# Patient Record
Sex: Female | Born: 2000 | Race: Black or African American | Hispanic: No | Marital: Single | Smoking: Never smoker
Health system: Southern US, Community
[De-identification: ages and names within clinical notes are randomized; demographics above are authoritative.]

---
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
## 2021-01-31 ENCOUNTER — Telehealth: Payer: Self-pay | Admitting: Adult Health

## 2021-01-31 NOTE — Telephone Encounter (Signed)
Spoke to patient about COVID 19 diagnosis.  Today is day 6=5 of infection with + at home test.  She has not had recent BMET and is not on any current medications.  Her BMI Is 26, and she meets criteria of an at risk group to receive treatment for COVID 19.  I sent a message to Dr. Nicholos Johns to evaluate the possibility of her getting in for lab testing today.  I reviewed with her paxlovid or molnupiravir.  I recommended that she also reach out to her PCP to request a lab appointment as today is her last day to start oral therapy.    Lillard Anes NP

## 2021-02-24 DIAGNOSIS — Z0000 Encounter for general adult medical examination without abnormal findings: Secondary | ICD-10-CM | POA: Diagnosis not present

## 2021-02-24 DIAGNOSIS — J3089 Other allergic rhinitis: Secondary | ICD-10-CM | POA: Diagnosis not present

## 2021-02-24 DIAGNOSIS — L309 Dermatitis, unspecified: Secondary | ICD-10-CM | POA: Diagnosis not present

## 2021-02-24 DIAGNOSIS — Z3041 Encounter for surveillance of contraceptive pills: Secondary | ICD-10-CM | POA: Diagnosis not present

## 2021-02-24 DIAGNOSIS — J45991 Cough variant asthma: Secondary | ICD-10-CM | POA: Diagnosis not present

## 2021-04-07 DIAGNOSIS — Z20822 Contact with and (suspected) exposure to covid-19: Secondary | ICD-10-CM | POA: Diagnosis not present

## 2021-04-21 DIAGNOSIS — D509 Iron deficiency anemia, unspecified: Secondary | ICD-10-CM | POA: Diagnosis not present

## 2022-03-03 DIAGNOSIS — Z13220 Encounter for screening for lipoid disorders: Secondary | ICD-10-CM | POA: Diagnosis not present

## 2022-03-03 DIAGNOSIS — Z0000 Encounter for general adult medical examination without abnormal findings: Secondary | ICD-10-CM | POA: Diagnosis not present

## 2022-03-03 DIAGNOSIS — Z23 Encounter for immunization: Secondary | ICD-10-CM | POA: Diagnosis not present

## 2022-09-05 DIAGNOSIS — Z01411 Encounter for gynecological examination (general) (routine) with abnormal findings: Secondary | ICD-10-CM | POA: Diagnosis not present

## 2022-09-05 DIAGNOSIS — Z124 Encounter for screening for malignant neoplasm of cervix: Secondary | ICD-10-CM | POA: Diagnosis not present

## 2022-09-05 DIAGNOSIS — Z113 Encounter for screening for infections with a predominantly sexual mode of transmission: Secondary | ICD-10-CM | POA: Diagnosis not present

## 2022-09-05 DIAGNOSIS — N912 Amenorrhea, unspecified: Secondary | ICD-10-CM | POA: Diagnosis not present

## 2023-03-15 DIAGNOSIS — Z0000 Encounter for general adult medical examination without abnormal findings: Secondary | ICD-10-CM | POA: Diagnosis not present

## 2023-03-15 DIAGNOSIS — Z13220 Encounter for screening for lipoid disorders: Secondary | ICD-10-CM | POA: Diagnosis not present

## 2023-11-26 DIAGNOSIS — Z01419 Encounter for gynecological examination (general) (routine) without abnormal findings: Secondary | ICD-10-CM | POA: Diagnosis not present

## 2023-11-26 DIAGNOSIS — Z113 Encounter for screening for infections with a predominantly sexual mode of transmission: Secondary | ICD-10-CM | POA: Diagnosis not present

## 2023-12-31 DIAGNOSIS — F419 Anxiety disorder, unspecified: Secondary | ICD-10-CM | POA: Diagnosis not present

## 2024-01-07 DIAGNOSIS — F4389 Other reactions to severe stress: Secondary | ICD-10-CM | POA: Diagnosis not present

## 2024-01-17 DIAGNOSIS — F4389 Other reactions to severe stress: Secondary | ICD-10-CM | POA: Diagnosis not present

## 2024-01-28 DIAGNOSIS — F4389 Other reactions to severe stress: Secondary | ICD-10-CM | POA: Diagnosis not present

## 2024-02-01 DIAGNOSIS — F4389 Other reactions to severe stress: Secondary | ICD-10-CM | POA: Diagnosis not present

## 2024-02-06 DIAGNOSIS — F4389 Other reactions to severe stress: Secondary | ICD-10-CM | POA: Diagnosis not present

## 2024-02-14 DIAGNOSIS — F4389 Other reactions to severe stress: Secondary | ICD-10-CM | POA: Diagnosis not present

## 2024-02-18 DIAGNOSIS — F4389 Other reactions to severe stress: Secondary | ICD-10-CM | POA: Diagnosis not present

## 2024-03-10 DIAGNOSIS — F4389 Other reactions to severe stress: Secondary | ICD-10-CM | POA: Diagnosis not present

## 2024-03-17 DIAGNOSIS — F4389 Other reactions to severe stress: Secondary | ICD-10-CM | POA: Diagnosis not present

## 2024-03-31 DIAGNOSIS — F4389 Other reactions to severe stress: Secondary | ICD-10-CM | POA: Diagnosis not present

## 2024-04-07 DIAGNOSIS — F4389 Other reactions to severe stress: Secondary | ICD-10-CM | POA: Diagnosis not present

## 2024-04-14 DIAGNOSIS — F4389 Other reactions to severe stress: Secondary | ICD-10-CM | POA: Diagnosis not present

## 2024-04-15 DIAGNOSIS — Z0000 Encounter for general adult medical examination without abnormal findings: Secondary | ICD-10-CM | POA: Diagnosis not present

## 2024-04-21 DIAGNOSIS — F4389 Other reactions to severe stress: Secondary | ICD-10-CM | POA: Diagnosis not present

## 2024-04-28 DIAGNOSIS — F4389 Other reactions to severe stress: Secondary | ICD-10-CM | POA: Diagnosis not present

## 2024-05-05 DIAGNOSIS — F4389 Other reactions to severe stress: Secondary | ICD-10-CM | POA: Diagnosis not present

## 2024-05-15 DIAGNOSIS — F4389 Other reactions to severe stress: Secondary | ICD-10-CM | POA: Diagnosis not present

## 2024-05-19 DIAGNOSIS — F4389 Other reactions to severe stress: Secondary | ICD-10-CM | POA: Diagnosis not present

## 2024-05-29 DIAGNOSIS — F4389 Other reactions to severe stress: Secondary | ICD-10-CM | POA: Diagnosis not present

## 2024-06-02 DIAGNOSIS — F4389 Other reactions to severe stress: Secondary | ICD-10-CM | POA: Diagnosis not present

## 2024-06-16 DIAGNOSIS — F4389 Other reactions to severe stress: Secondary | ICD-10-CM | POA: Diagnosis not present

## 2024-08-11 DIAGNOSIS — F4389 Other reactions to severe stress: Secondary | ICD-10-CM | POA: Diagnosis not present

## 2024-08-25 DIAGNOSIS — F4389 Other reactions to severe stress: Secondary | ICD-10-CM | POA: Diagnosis not present

## 2024-09-01 DIAGNOSIS — F4389 Other reactions to severe stress: Secondary | ICD-10-CM | POA: Diagnosis not present

## 2024-09-11 DIAGNOSIS — F4389 Other reactions to severe stress: Secondary | ICD-10-CM | POA: Diagnosis not present

## 2024-09-15 DIAGNOSIS — F4389 Other reactions to severe stress: Secondary | ICD-10-CM | POA: Diagnosis not present

## 2024-09-24 DIAGNOSIS — F4389 Other reactions to severe stress: Secondary | ICD-10-CM | POA: Diagnosis not present

## 2024-10-06 DIAGNOSIS — F4389 Other reactions to severe stress: Secondary | ICD-10-CM | POA: Diagnosis not present
